# Patient Record
Sex: Female | Born: 1955 | Race: White | Hispanic: No | Marital: Married
Health system: Southern US, Community
[De-identification: ages and names within clinical notes are randomized; demographics above are authoritative.]

---

## 1999-06-22 ENCOUNTER — Other Ambulatory Visit: Admission: RE | Admit: 1999-06-22 | Discharge: 1999-06-22 | Payer: Self-pay | Admitting: *Deleted

## 2000-08-13 ENCOUNTER — Other Ambulatory Visit: Admission: RE | Admit: 2000-08-13 | Discharge: 2000-08-13 | Payer: Self-pay | Admitting: *Deleted

## 2000-12-27 ENCOUNTER — Other Ambulatory Visit: Admission: RE | Admit: 2000-12-27 | Discharge: 2000-12-27 | Payer: Self-pay | Admitting: *Deleted

## 2001-03-10 ENCOUNTER — Encounter (INDEPENDENT_AMBULATORY_CARE_PROVIDER_SITE_OTHER): Payer: Self-pay | Admitting: Specialist

## 2001-03-10 ENCOUNTER — Inpatient Hospital Stay (HOSPITAL_COMMUNITY): Admission: AD | Admit: 2001-03-10 | Discharge: 2001-03-10 | Payer: Self-pay | Admitting: Obstetrics and Gynecology

## 2001-05-29 ENCOUNTER — Other Ambulatory Visit: Admission: RE | Admit: 2001-05-29 | Discharge: 2001-05-29 | Payer: Self-pay | Admitting: Obstetrics and Gynecology

## 2002-09-03 ENCOUNTER — Other Ambulatory Visit: Admission: RE | Admit: 2002-09-03 | Discharge: 2002-09-03 | Payer: Self-pay | Admitting: Obstetrics and Gynecology

## 2003-09-09 ENCOUNTER — Other Ambulatory Visit: Admission: RE | Admit: 2003-09-09 | Discharge: 2003-09-09 | Payer: Self-pay | Admitting: Internal Medicine

## 2004-11-21 ENCOUNTER — Encounter (INDEPENDENT_AMBULATORY_CARE_PROVIDER_SITE_OTHER): Payer: Self-pay | Admitting: *Deleted

## 2004-11-21 ENCOUNTER — Ambulatory Visit (HOSPITAL_BASED_OUTPATIENT_CLINIC_OR_DEPARTMENT_OTHER): Admission: RE | Admit: 2004-11-21 | Discharge: 2004-11-21 | Payer: Self-pay | Admitting: Urology

## 2004-11-21 ENCOUNTER — Ambulatory Visit (HOSPITAL_COMMUNITY): Admission: RE | Admit: 2004-11-21 | Discharge: 2004-11-21 | Payer: Self-pay | Admitting: Urology

## 2005-02-14 ENCOUNTER — Encounter: Admission: RE | Admit: 2005-02-14 | Discharge: 2005-02-14 | Payer: Self-pay | Admitting: Obstetrics and Gynecology

## 2005-03-22 ENCOUNTER — Ambulatory Visit (HOSPITAL_COMMUNITY): Admission: RE | Admit: 2005-03-22 | Discharge: 2005-03-22 | Payer: Self-pay | Admitting: Obstetrics and Gynecology

## 2005-03-22 ENCOUNTER — Encounter (INDEPENDENT_AMBULATORY_CARE_PROVIDER_SITE_OTHER): Payer: Self-pay | Admitting: Specialist

## 2006-03-22 ENCOUNTER — Encounter: Admission: RE | Admit: 2006-03-22 | Discharge: 2006-03-22 | Payer: Self-pay | Admitting: Internal Medicine

## 2006-04-04 ENCOUNTER — Encounter: Admission: RE | Admit: 2006-04-04 | Discharge: 2006-04-04 | Payer: Self-pay | Admitting: Internal Medicine

## 2007-05-16 ENCOUNTER — Encounter: Admission: RE | Admit: 2007-05-16 | Discharge: 2007-05-16 | Payer: Self-pay | Admitting: Internal Medicine

## 2007-06-08 ENCOUNTER — Encounter: Admission: RE | Admit: 2007-06-08 | Discharge: 2007-06-08 | Payer: Self-pay | Admitting: Sports Medicine

## 2007-12-25 ENCOUNTER — Ambulatory Visit: Payer: Self-pay | Admitting: Vascular Surgery

## 2008-01-27 ENCOUNTER — Ambulatory Visit: Payer: Self-pay | Admitting: Vascular Surgery

## 2008-03-08 ENCOUNTER — Ambulatory Visit: Payer: Self-pay | Admitting: Vascular Surgery

## 2008-03-15 ENCOUNTER — Ambulatory Visit: Payer: Self-pay | Admitting: Vascular Surgery

## 2008-03-25 ENCOUNTER — Ambulatory Visit: Payer: Self-pay | Admitting: Vascular Surgery

## 2008-04-15 ENCOUNTER — Ambulatory Visit: Payer: Self-pay | Admitting: Vascular Surgery

## 2008-06-24 ENCOUNTER — Encounter: Admission: RE | Admit: 2008-06-24 | Discharge: 2008-06-24 | Payer: Self-pay | Admitting: Internal Medicine

## 2008-07-01 ENCOUNTER — Encounter: Admission: RE | Admit: 2008-07-01 | Discharge: 2008-07-01 | Payer: Self-pay | Admitting: Internal Medicine

## 2009-05-15 ENCOUNTER — Emergency Department (HOSPITAL_COMMUNITY): Admission: EM | Admit: 2009-05-15 | Discharge: 2009-05-15 | Payer: Self-pay | Admitting: Emergency Medicine

## 2009-09-13 ENCOUNTER — Encounter: Admission: RE | Admit: 2009-09-13 | Discharge: 2009-09-13 | Payer: Self-pay | Admitting: Internal Medicine

## 2010-01-18 ENCOUNTER — Encounter: Admission: RE | Admit: 2010-01-18 | Discharge: 2010-01-18 | Payer: Self-pay | Admitting: Obstetrics & Gynecology

## 2010-08-22 NOTE — Assessment & Plan Note (Signed)
OFFICE VISIT   Annette, Frey H  DOB:  26-Oct-1955                                       03/15/2008  [id number]#:[id number]   The patient underwent laser ablation of her left greater saphenous vein  with multiple stab phlebectomies 1 week ago for painful varicosities in  the thigh and the calf.  She has had minimal discomfort after this  procedure.  There has been some mild ecchymosis in the medial thigh  area, but very little tenderness she states.  She states that her leg  already feels better in the distal thigh and proximal calf area where  the varicosities were located laterally.  She has had no distal edema.   EXAMINATION:  The stab phlebectomy sites are healing nicely and there is  no distal edema with some mild ecchymosis medially and some mild  tenderness over the left greater saphenous vein.   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  She was reassured regarding this and will  return for 2 sessions of sclerotherapy to complete her treatment.  She  is very pleased with her early result.   Quita Skye Hart [place], M.D.  Electronically Signed   JDL/MEDQ  D:  03/15/2008  T:  03/16/2008  Job:  [id number]

## 2010-08-22 NOTE — Consult Note (Signed)
VASCULAR SURGERY CONSULTATION   Wire, Milina H  DOB:  01-25-1956                                       01/27/2008  [id number]#:[id number]   The patient is a 55 year old female who is for vascular surgery  evaluation regarding severe venous insufficiency with aching, throbbing,  and burning sensation in the left leg.  She describes an itching,  nocturnal cramping, tenderness, and heaviness in the left leg, both in  the thigh and the calf.  Over the last 3 years, she has noticed  increasing bulging varicosities in the left leg, particularly in the  distal thigh extending down around the knee.  She had some sclerotherapy  with isotonic saline in 1994 by Dr. Graylon Gunning with early result, which  was satisfactory.  These have now recurred.  She does not have  significant symptoms in the contralateral right leg.  She has no history  of deep vein thrombosis, thrombophlebitis, pulmonary emboli.  She does  have swelling in both feet and ankles toward the end of the day.  She  has been wearing graduated compression, elastic compression stockings  within the last year with no improvement in her symptoms, and has also  tried elevation of the legs, as well as pain medication.  She has been  thoroughly evaluated at [facility] in June of  this year and treated conservatively with no improvement in her  symptoms.  She had a venous duplex exam performed at [facility] and  repeated today for confirmation in our office.  This reveals some  refluxing in the lateral accessory great saphenous vein communicating at  the saphenofemoral junction where there is gross reflux at the junction.  This communicates with the varicosities along the anterolateral aspect  of the left thigh and down the left calf, where the symptomatology is  most severe.  She has no deep vein insufficiency or obstruction.   ALLERGIES:  Sulfa.   PAST MEDICAL HISTORY:  Includes Graves disease  status post radiation and  now hypothyroidism.   MEDICATIONS:  Include Synthroid and Indocin.   PREVIOUS SURGERY:  Includes arthroscopy of the left knee.   FAMILY HISTORY:  Positive for venous disease in her mother and  grandmother.   PHYSICAL EXAM:  Blood pressure 126/83, heart rate is 99, respirations  14.  Generally, she is a healthy-appearing female, in no apparent  distress.  Alert and oriented x3.  Neck is supple.  3+ carotid pulses  palpable.  No bruits are audible.  Neurologic exam is normal.  No  palpable adenopathy in the neck.  Chest is clear to auscultation.  Cardiovascular exam is regular rhythm with no murmurs.  Abdomen is soft  and nontender with no masses.  She has 3+ femoral, popliteal, dorsalis  pedis, and posterior tibial pulses palpable bilaterally.  Left leg has  diffuse varicosities beginning in the anterolateral thigh extending down  anteriorly in the thigh and lateral to the knee, extending into the left  pretibial area.  These are larger in the thigh and become slightly  smaller in the calf area and the pretibial region.  She also has some  medial calf varicosities of the great saphenous system.  She has 1+  edema distally with no hyperpigmentation or ulceration.  Right leg has  smaller varicosities in a similar distribution, but no distal edema.  I do think that this patient has significant symptomatology of her  venous disease in her left leg, which is affecting her daily living, and  not controlled by conservative measures, such as elastic compression,  elevation, and analgesics.  I would recommend laser ablation of the  lateral accessory branch of her left greater saphenous vein where the  reflux is most prominent, up to the saphenofemoral junction, as well as  stab phlebectomies in the same setting in the thigh.  She will then  require at least 2 sessions of sclerotherapy for the other varicosities.  We will proceed with preauthorization for this.    Quita Skye Hart [place], M.D.  Electronically Signed  JDL/MEDQ  D:  01/27/2008  T:  01/28/2008  Job:  [id number]

## 2010-08-22 NOTE — Procedures (Signed)
DUPLEX DEEP VENOUS EXAM - LOWER EXTREMITY   INDICATION:  Follow-up evaluation of left leg laser ablation of greater  saphenous vein.   HISTORY:  Edema:  No.  Trauma/Surgery:  One week post left leg ablation of greater saphenous  vein.  Pain:  Residual left leg pain.  PE:  No.  Previous DVT:  No.  Anticoagulants:  No.  Other:  Thrombosis.   DUPLEX EXAM:                CFV   SFV   PopV  PTV    GSV                R  L  R  L  R  L  R   L  R  L  Thrombosis       o     o     o      o     +  Spontaneous      +     +     +      +     o  Phasic           +     +     +      +     o  Augmentation     +     +     +      +     o  Compressible     +     +     +      +     o  Competent        o     +     +      +     o   Legend:  + - yes  o - no  p - partial  D - decreased   IMPRESSION:  1. The lateral branch of the greater saphenous vein is thrombosed from      the saphenofemoral junction to the mid thigh.  The mid thigh      lateral branch is patent.  Varicose vein branches of the lateral      branch in the distal thigh are thrombosed.  2. The medial left greater saphenous vein branch is patent with      minimal reflux.  3. Left common femoral vein demonstrates reflux.        _____________________________  Quita Skye. Hart [place], M.D.   MC/MEDQ  D:  03/15/2008  T:  03/16/2008  Job:  [id number]

## 2010-08-22 NOTE — Procedures (Signed)
LOWER EXTREMITY VENOUS REFLUX EXAM   INDICATION:  Left leg varicose vein with pain.   EXAM:  Using color-flow imaging and pulse Doppler spectral analysis, the  left common femoral, superficial femoral, popliteal, posterior tibial,  greater and lesser saphenous veins are evaluated.  There is no evidence  suggesting deep venous insufficiency in the left lower extremity.   The left saphenofemoral junction is not competent.  The left GSV is  competent with the caliber as described below.   The left proximal short saphenous vein demonstrates competency.   GSV Diameter (used if found to be incompetent only)                                            Right    Left  Proximal Greater Saphenous Vein           cm       cm  Proximal-to-mid-thigh                     cm       cm  Mid thigh                                 cm       cm  Mid-distal thigh                          cm       cm  Distal thigh                              cm       cm  Knee                                      cm       cm   IMPRESSION:  1. Left greater saphenous vein, lateral branch, reflux is identified      with the caliber ranging from 0.44 cm to 0.80 cm, mid thigh to      groin.  2. The left greater saphenous vein is not aneurysmal.  3. The left greater saphenous vein is not tortuous.  4. The deep venous system is competent.  5. The left lesser saphenous vein is competent.  6. No evidence of deep venous thrombosis noted in the left leg.       ___________________________________________  Quita Skye. Hart [place], M.D.   MG/MEDQ  D:  01/27/2008  T:  01/27/2008  Job:  [id number]

## 2010-08-25 NOTE — Op Note (Signed)
Annette Frey, Annette Frey               ACCOUNT NO.:  [id number]   MEDICAL RECORD NO.:  [id number]          PATIENT TYPE:  AMB   LOCATION:  [facility]                           FACILITY:  [facility]   PHYSICIAN:  Maxie Better, M.D.DATE OF BIRTH:  05-Mar-1956   DATE OF PROCEDURE:  03/22/2005  DATE OF DISCHARGE:                                 OPERATIVE REPORT   PREOPERATIVE DIAGNOSES:  1.  Dysfunctional uterine bleeding.  2.  Endometrial mass.   OPERATION/PROCEDURE:  1.  Diagnostic hysteroscopy.  2.  Hysteroscopic resection of the endometrial polyp.  3.  Dilatation and curettage.   POSTOPERATIVE DIAGNOSES:  1.  Dysfunctional uterine bleeding.  2.  Endometrial mass.   ANESTHESIA:  General.   SURGEON:  Maxie Better, M.D.   DESCRIPTION OF PROCEDURE:  Under adequate general anesthesia, the patient  was placed in the dorsal lithotomy position.  She was sterilely prepped and  draped in the usual fashion.  The bladder was catheterized for a scant  amount of urine.  Examination under anesthesia revealed anteverted uterus.  No adnexal masses could be appreciated.  A bivalve speculum was placed in  the vagina.  A single-tooth tenaculum was placed on the anterior lip of the  cervix.  The cervix was then serially dilated up to #31 [facility] dilator.  A  resectoscope was then introduced into the uterine cavity without incident.  Thickened endometrium was noted.  In particularly, left posterior aspect of  the endometrial cavity had a raised broadly based lesion suggestive of  endometrial polyp.  The thickening and the polypoid lesion was resected.  The resectoscope was then removed.  The cavity was then curetted for a large  amount of tissue.  The resectoscope was then reinserted, cavity was  inspected.  Both tubal ostia could be seen well.  The endocervical canal was  inspected.  No lesions noted.  Given the absence of any further lesions, the  resectoscope was removed.  The cavity was curetted and  all instruments were  then removed from the vagina.  Specimens labeled initial curettings and  endometrial resection was sent to pathology.  Estimated blood loss was  minimal.  Fluid deficit was 50 mL.  There were no complications.  The  patient tolerated the procedure well and was transferred to the recovery  room in stable condition.      Maxie Better, M.D.  Electronically Signed    [place]/MEDQ  D:  03/22/2005  T:  03/23/2005  Job:  [id number]

## 2010-08-25 NOTE — Op Note (Signed)
Annette Frey, Annette Frey               ACCOUNT NO.:  [id number]   MEDICAL RECORD NO.:  [id number]          PATIENT TYPE:  AMB   LOCATION:  [facility]                         FACILITY:  [facility]   PHYSICIAN:  Jamison Neighbor, M.D.  DATE OF BIRTH:  Nov 25, 1955   DATE OF PROCEDURE:  11/21/2004  DATE OF DISCHARGE:                                 OPERATIVE REPORT   PREOPERATIVE DIAGNOSES:  1.  Hematuria.  2.  Chronic cystitis.  3.  Urgency incontinence.  4.  Rule out interstitial cystitis.   POSTOPERATIVE DIAGNOSES:  1.  Hematuria.  2.  Chronic cystitis.  3.  Urgency incontinence.  4.  Rule out interstitial cystitis.  5.  Squamous metaplasia.   PROCEDURE:  Cystoscopy, urethral calibration, hydrodistention of the  bladder, bilateral retrogrades with interpretation, bladder biopsy with  cauterization, cauterization of squamous metaplasia.   SURGEON:  Jamison Neighbor, M.D.   ANESTHESIA:  General.   COMPLICATIONS:  None.   DRAINS:  None.   HISTORY:  This 55 year old female presenting to the office with problems  with poorly controlled urgency and frequency and intermittent episodes of  chronic cystitis.  The patient has had chronic pain in the bladder and  urethra and it has been thought that she might have interstitial cystitis.  As part of her evaluation, we found that she did have abnormal urine.  The  patient was placed on antibiotic prophylaxis in order to eliminate the risks  for infection.  She was placed on Vesicare for her urgency and frequency.  She does feel somewhat better, indicating that she likely does not have IC,  but she does need cystoretrogrades, etc., because of the long smoking  history.  The patient understands that she will undergo retrograde studies  plus a probable biopsy to rule out carcinoma in situ.  She gave full  informed consent.   DESCRIPTION OF PROCEDURE:  After successful induction of general anesthesia,  the patient was placed in the dorsal lithotomy  position, prepped with  Betadine and draped in the usual sterile fashion.  Careful bimanual  examination revealed no evidence of a cystocele, rectocele, or enterocele.  There were no masses on bimanual exam.  Uterus was palpably normal with no  signs of abnormalities.  The cystourethra was dilated to 32-French with no  signs of stenosis or stricture indicating an unremarkable urethra.  The  cystoscope was inserted.  The bladder was carefully inspected.  It was free  of any tumor or stones.  There was no evidence of carcinoma in situ or other  irregularities caused by her smoking.  The patient did, however, have  significant squamous metaplasia to the base of the bladder.  This may be  what previous urologist had told her was bladder polyps.  The patient  underwent bilateral retrograde studies.  The retrograde ureteral pyelograms  were interpreted as normal.  Contrast was injected up a 5-French open-ended  catheter on each side.  The ureters were normal in their appearance.  No  filling defects could be seen.  The collecting system on both sides filled  normally without any  evidence of hydronephrosis or lesion.  The drain out  films were normal.  The patient then underwent hydrodistention.  The bladder  was filled at a pressure of 100 cmH2O for 5 minutes.  When the bladder was  drained, no glomerulations or ulcers could be seen.  The bladder capacity  was just under 1000 cc, which is quite close to normal bladder capacity  indicating that this patient's problem is much more of an issue with urgency  and chronic cystitis versus a true interstitial cystitis.  A bladder biopsy  was performed in the area of the trigone.  This will be sent for analysis to  determine if there is any evidence of carcinoma in situ.  The biopsy site  was cauterized, as was the area of squamous metaplasia, with the hope that  this would cut down on the patient's persistent hematuria.  The bladder was  drained.  A  mixture of Marcaine and Pyridium was left in the bladder.  Marcaine and Pyridium  were injected periurethrally.  The patient received  intraoperative Toradol and Zofran as well as a B&O suppository.  She will  continue on her current medications, most particularly the Vesicare which  she has found quite helpful, and the Macrodantin prophylaxis.  She will be  sent home with a urinary analgesic as well as pain medication.  She will  return to the office in followup.  Our plan long-term is to keep her on  anticholinergics plus antibiotic suppression and only consider additional  evaluation for enterocele cystitis if her symptoms should deteriorate on  that regimen.           ______________________________  Jamison Neighbor, M.D.  Electronically Signed     RJE/MEDQ  D:  11/21/2004  T:  11/21/2004  Job:  [id number]   cc:   Edwena Felty. Romine, M.D.  [address], [address]  [place]  [place] [address]  Fax: [phone]   Larina Earthly, M.D.  [address]  [place]  [place] [address]  Fax: [phone]

## 2013-03-11 ENCOUNTER — Other Ambulatory Visit: Payer: Self-pay

## 2013-03-11 DIAGNOSIS — Z1231 Encounter for screening mammogram for malignant neoplasm of breast: Secondary | ICD-10-CM

## 2013-04-14 ENCOUNTER — Other Ambulatory Visit: Payer: Self-pay

## 2013-04-14 ENCOUNTER — Ambulatory Visit
Admission: RE | Admit: 2013-04-14 | Discharge: 2013-04-14 | Disposition: A | Discharge status: Home / Self Care | Payer: BC Managed Care – PPO | Source: Ambulatory Visit

## 2013-04-14 DIAGNOSIS — Z1231 Encounter for screening mammogram for malignant neoplasm of breast: Secondary | ICD-10-CM

## 2013-04-17 ENCOUNTER — Other Ambulatory Visit: Payer: Self-pay | Admitting: Internal Medicine

## 2013-04-17 DIAGNOSIS — R928 Other abnormal and inconclusive findings on diagnostic imaging of breast: Secondary | ICD-10-CM

## 2013-05-05 ENCOUNTER — Ambulatory Visit
Admission: RE | Admit: 2013-05-05 | Discharge: 2013-05-05 | Disposition: A | Discharge status: Home / Self Care | Payer: BC Managed Care – PPO | Source: Ambulatory Visit | Attending: Internal Medicine | Admitting: Internal Medicine

## 2013-05-05 DIAGNOSIS — R928 Other abnormal and inconclusive findings on diagnostic imaging of breast: Secondary | ICD-10-CM

## 2014-07-06 ENCOUNTER — Other Ambulatory Visit: Payer: Self-pay

## 2014-07-06 DIAGNOSIS — Z1231 Encounter for screening mammogram for malignant neoplasm of breast: Secondary | ICD-10-CM

## 2014-07-14 ENCOUNTER — Ambulatory Visit
Admission: RE | Admit: 2014-07-14 | Discharge: 2014-07-14 | Disposition: A | Discharge status: Home / Self Care | Payer: BLUE CROSS/BLUE SHIELD | Source: Ambulatory Visit

## 2014-07-14 DIAGNOSIS — Z1231 Encounter for screening mammogram for malignant neoplasm of breast: Secondary | ICD-10-CM

## 2015-11-23 ENCOUNTER — Other Ambulatory Visit: Payer: Self-pay | Admitting: Internal Medicine

## 2015-11-23 DIAGNOSIS — Z1231 Encounter for screening mammogram for malignant neoplasm of breast: Secondary | ICD-10-CM

## 2015-12-05 ENCOUNTER — Ambulatory Visit
Admission: RE | Admit: 2015-12-05 | Discharge: 2015-12-05 | Disposition: A | Discharge status: Home / Self Care | Payer: BLUE CROSS/BLUE SHIELD | Source: Ambulatory Visit | Attending: Internal Medicine | Admitting: Internal Medicine

## 2015-12-05 DIAGNOSIS — Z1231 Encounter for screening mammogram for malignant neoplasm of breast: Secondary | ICD-10-CM

## 2015-12-13 ENCOUNTER — Other Ambulatory Visit: Payer: Self-pay | Admitting: Orthopedic Surgery

## 2015-12-13 DIAGNOSIS — M25551 Pain in right hip: Secondary | ICD-10-CM

## 2015-12-28 ENCOUNTER — Ambulatory Visit
Admission: RE | Admit: 2015-12-28 | Discharge: 2015-12-28 | Disposition: A | Discharge status: Home / Self Care | Payer: BLUE CROSS/BLUE SHIELD | Source: Ambulatory Visit | Attending: Orthopedic Surgery | Admitting: Orthopedic Surgery

## 2015-12-28 DIAGNOSIS — M25551 Pain in right hip: Secondary | ICD-10-CM

## 2015-12-28 MED ORDER — IOPAMIDOL (ISOVUE-M 200) INJECTION 41%
9.0000 mL | Freq: Once | INTRAMUSCULAR | Status: AC
  Administered 2015-12-28: 9 mL via INTRA_ARTICULAR

## 2017-04-15 IMAGING — MG 2D DIGITAL SCREENING BILATERAL MAMMOGRAM WITH CAD AND ADJUNCT TO
9 of 13 series · 9 of 29 positions shown · non-contrast
Comparison: Previous exam(s).

CLINICAL DATA: Screening.

EXAM:
2D DIGITAL SCREENING BILATERAL MAMMOGRAM WITH CAD AND ADJUNCT TOMO

[L CC (1 of 2)]
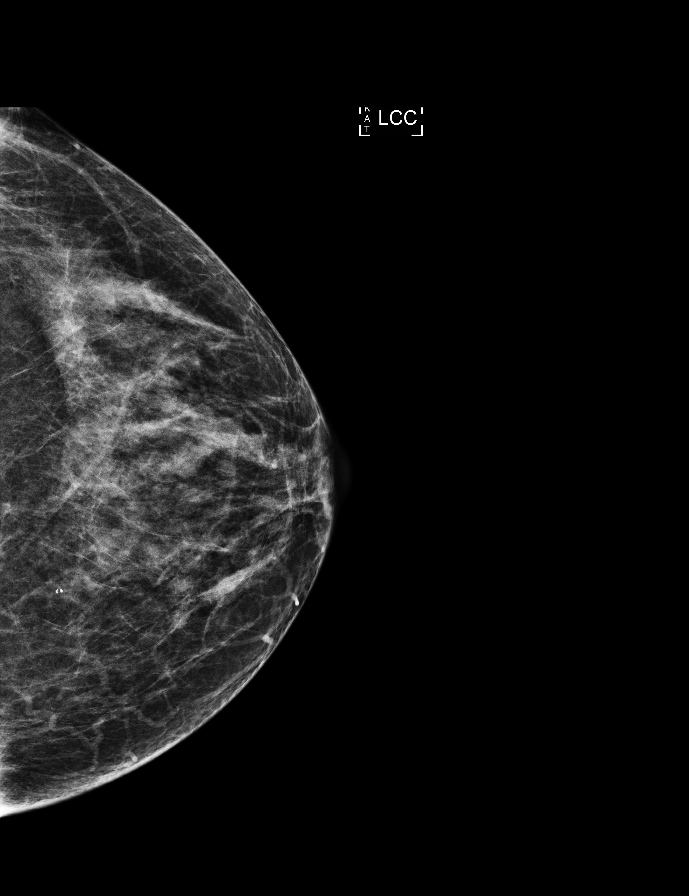

[L CC synth-2D]
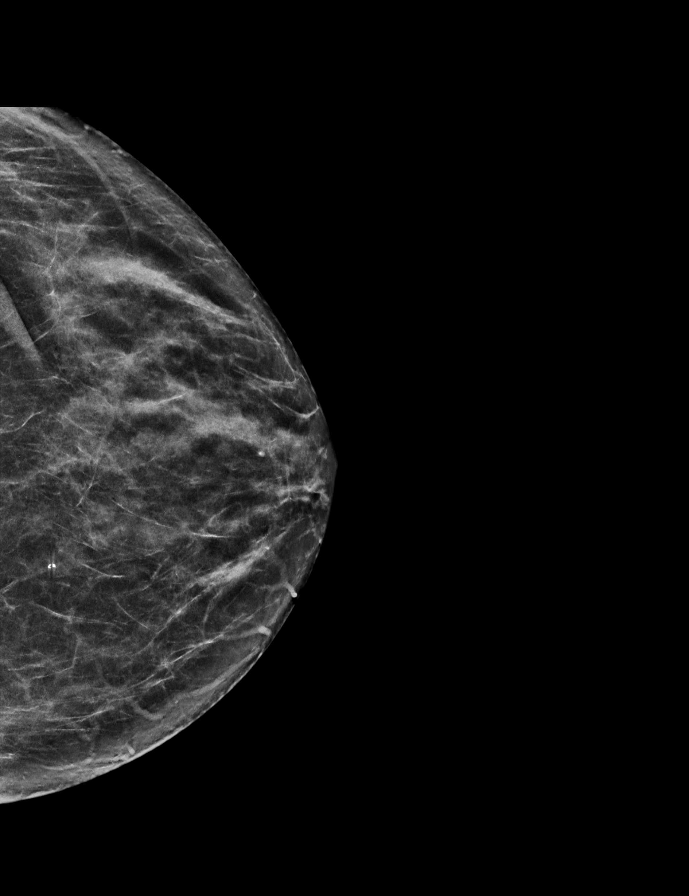

[R CC]
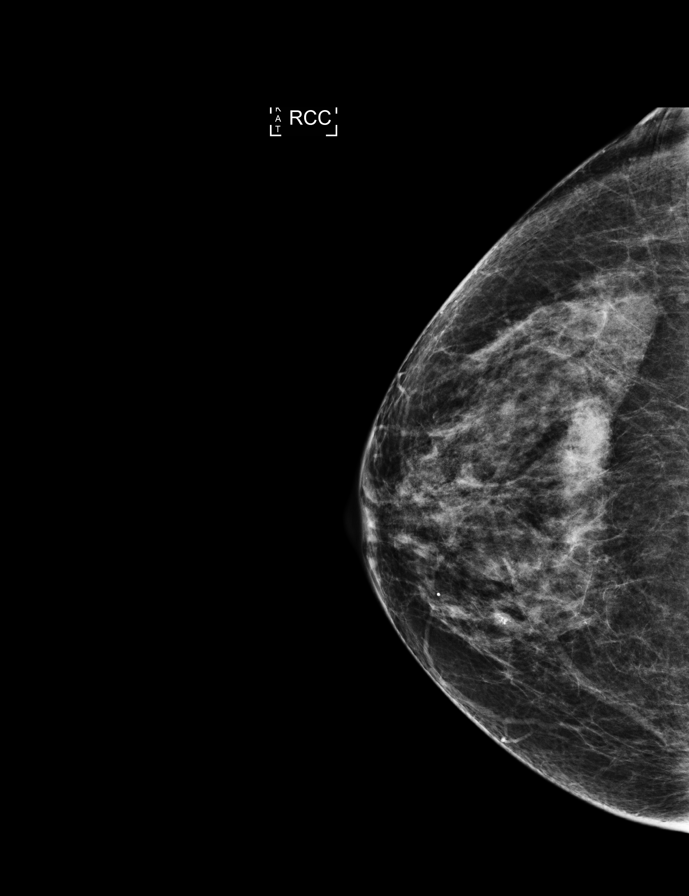

[R MLO]
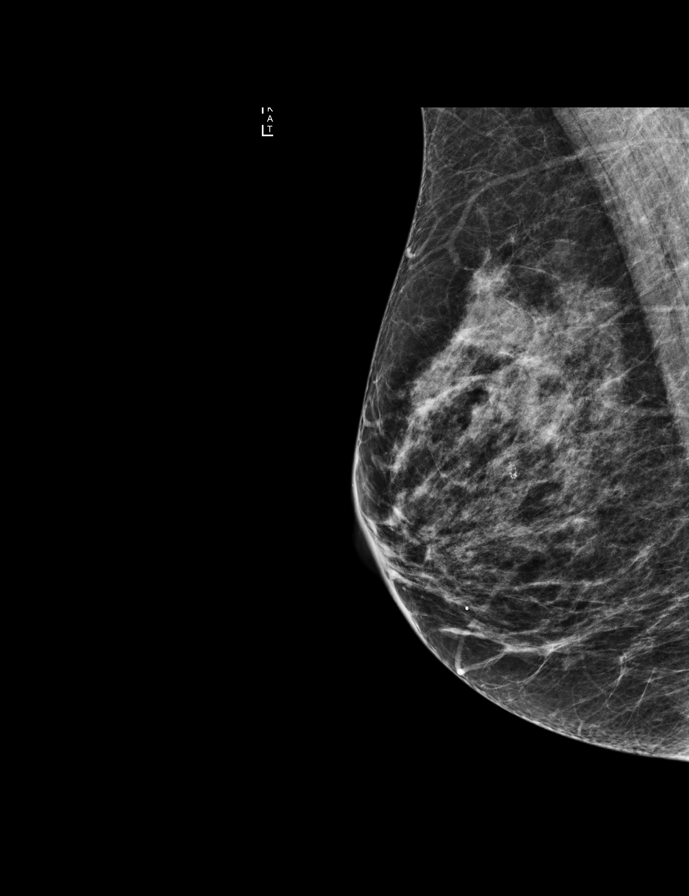

[R CC synth-2D]
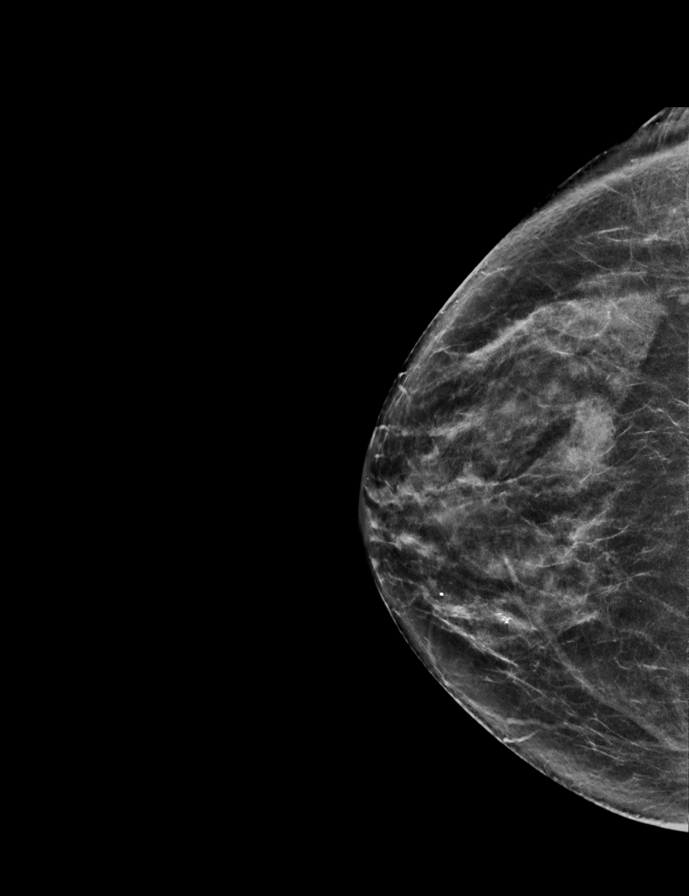

[L MLO]
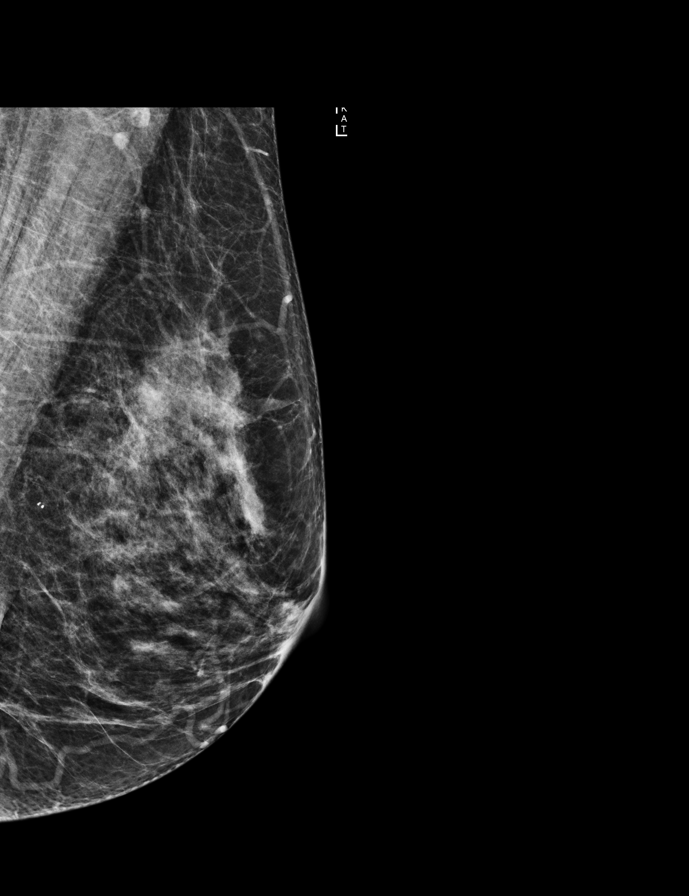

[R MLO synth-2D]
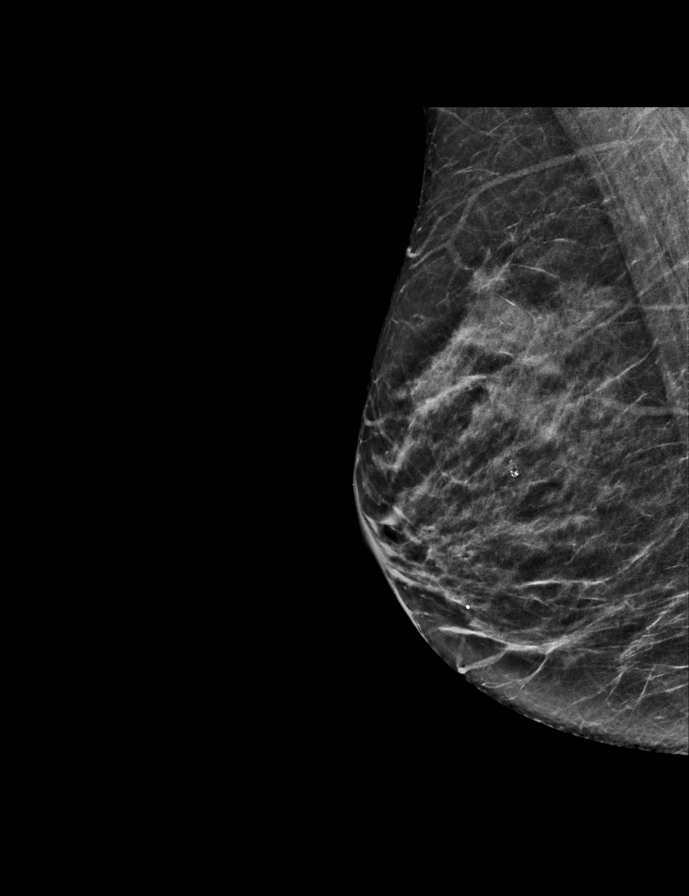

[L MLO synth-2D]
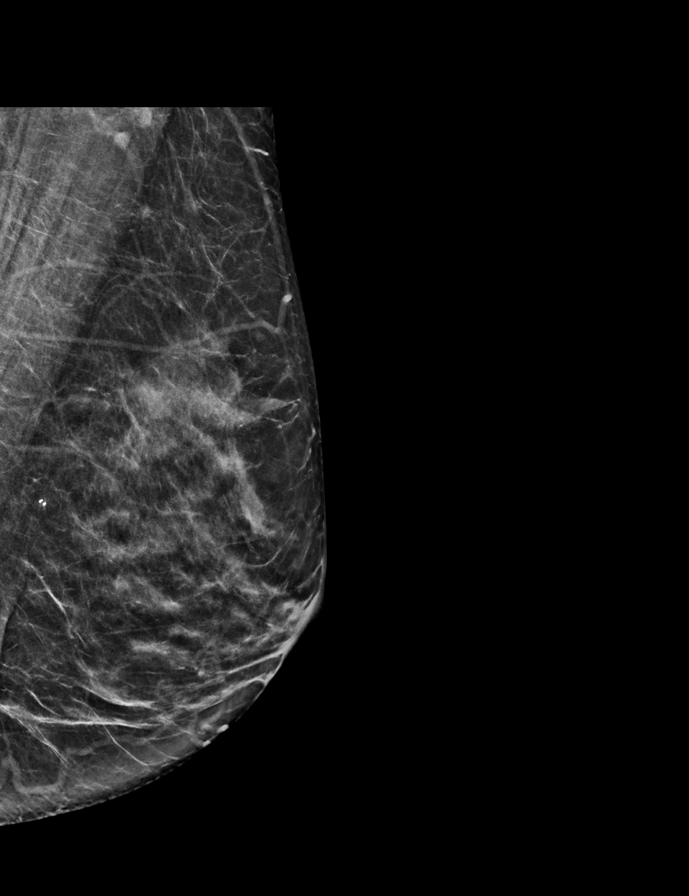

[L CC (2 of 2)]
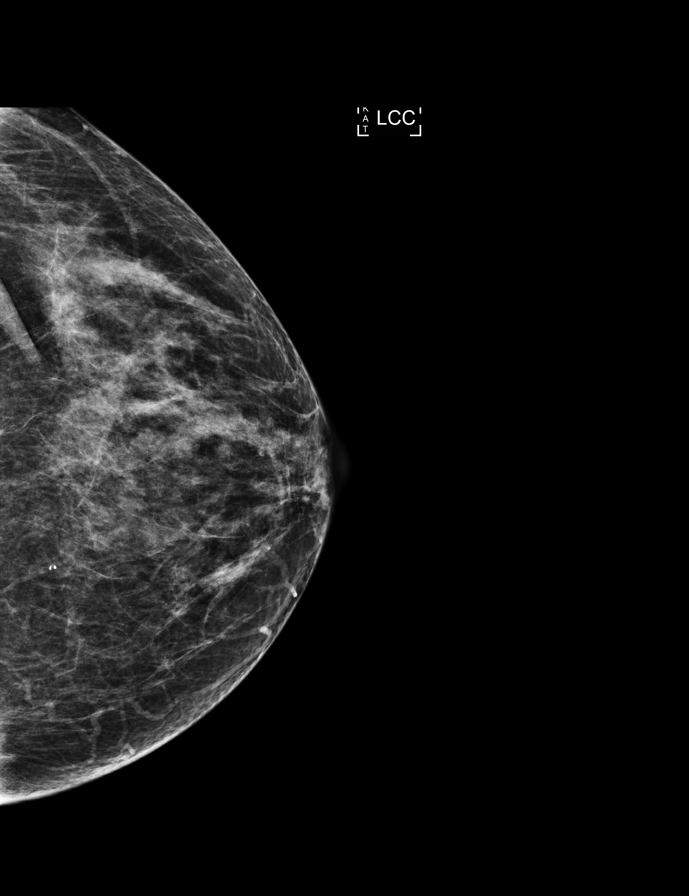

[9 of 29 positions shown; findings below may reference images not displayed]

ACR Breast Density Category c: The breast tissue is heterogeneously
dense, which may obscure small masses.
FINDINGS: There are no findings suspicious for malignancy. Images were
processed with CAD.
IMPRESSION: No mammographic evidence of malignancy. A result letter of this
screening mammogram will be mailed directly to the patient.

RECOMMENDATION:
Screening mammogram in one year. (Code:TN-0-K4T)

BI-RADS CATEGORY  1: Negative.

## 2017-11-11 ENCOUNTER — Other Ambulatory Visit: Payer: Self-pay | Admitting: Internal Medicine

## 2017-11-11 DIAGNOSIS — Z1231 Encounter for screening mammogram for malignant neoplasm of breast: Secondary | ICD-10-CM

## 2017-11-12 ENCOUNTER — Other Ambulatory Visit: Payer: Self-pay | Admitting: Adult Health

## 2017-11-12 ENCOUNTER — Other Ambulatory Visit: Payer: Self-pay | Admitting: Internal Medicine

## 2017-11-12 DIAGNOSIS — N644 Mastodynia: Secondary | ICD-10-CM

## 2019-06-19 ENCOUNTER — Encounter (HOSPITAL_COMMUNITY): Payer: No Typology Code available for payment source

## 2019-06-19 ENCOUNTER — Encounter (HOSPITAL_COMMUNITY): Admission: RE | Admit: 2019-06-19 | Payer: No Typology Code available for payment source | Source: Ambulatory Visit

## 2019-06-25 ENCOUNTER — Ambulatory Visit: Admit: 2019-06-25 | Payer: BLUE CROSS/BLUE SHIELD | Admitting: Orthopedic Surgery

## 2019-06-25 SURGERY — ARTHROPLASTY, HIP, TOTAL, ANTERIOR APPROACH
Anesthesia: Spinal | Site: Hip | Laterality: Left

## 2023-07-17 ENCOUNTER — Other Ambulatory Visit (HOSPITAL_COMMUNITY): Payer: Self-pay | Admitting: *Deleted

## 2023-07-18 ENCOUNTER — Ambulatory Visit (HOSPITAL_COMMUNITY)
Admission: RE | Admit: 2023-07-18 | Discharge: 2023-07-18 | Disposition: A | Discharge status: Home / Self Care | Source: Ambulatory Visit | Attending: Internal Medicine | Admitting: Internal Medicine

## 2023-07-18 DIAGNOSIS — E785 Hyperlipidemia, unspecified: Secondary | ICD-10-CM | POA: Diagnosis present

## 2023-07-18 MED ORDER — ROMOSOZUMAB-AQQG 105 MG/1.17ML ~~LOC~~ SOSY
210.0000 mg | PREFILLED_SYRINGE | Freq: Once | SUBCUTANEOUS | Status: DC
  Administered 2023-07-18: 210 mg via SUBCUTANEOUS
  Filled 2023-07-18: qty 2.4

## 2023-08-19 ENCOUNTER — Ambulatory Visit (HOSPITAL_COMMUNITY)
Admission: RE | Admit: 2023-08-19 | Discharge: 2023-08-19 | Disposition: A | Discharge status: Home / Self Care | Payer: Self-pay | Source: Ambulatory Visit | Attending: Internal Medicine | Admitting: Internal Medicine

## 2023-08-19 DIAGNOSIS — M810 Age-related osteoporosis without current pathological fracture: Secondary | ICD-10-CM | POA: Insufficient documentation

## 2023-08-19 MED ORDER — ROMOSOZUMAB-AQQG 105 MG/1.17ML ~~LOC~~ SOSY
210.0000 mg | PREFILLED_SYRINGE | Freq: Once | SUBCUTANEOUS | Status: DC
  Administered 2023-08-19: 210 mg via SUBCUTANEOUS
  Filled 2023-08-19: qty 2.4

## 2023-09-13 ENCOUNTER — Telehealth: Payer: Self-pay

## 2023-09-13 NOTE — Telephone Encounter (Signed)
 Auth Submission: NO AUTH NEEDED Site of care: Site of care: [facility] Payer: Medicare A/B with BCBS supplement Medication & CPT/J Code(s) submitted: Evenity  (Romosozumab ) J3111 Route of submission (phone, fax, portal):  Phone # Fax # Auth type: Buy/Bill PB Units/visits requested: 210mg  x 10 doses Reference number:  Approval from: 07/09/23 to 05/09/24

## 2023-09-16 ENCOUNTER — Ambulatory Visit (HOSPITAL_COMMUNITY)
Admission: RE | Admit: 2023-09-16 | Discharge: 2023-09-16 | Disposition: A | Discharge status: Home / Self Care | Source: Ambulatory Visit | Attending: Internal Medicine | Admitting: Internal Medicine

## 2023-09-16 DIAGNOSIS — M810 Age-related osteoporosis without current pathological fracture: Secondary | ICD-10-CM | POA: Insufficient documentation

## 2023-09-16 MED ORDER — ROMOSOZUMAB-AQQG 105 MG/1.17ML ~~LOC~~ SOSY
210.0000 mg | PREFILLED_SYRINGE | Freq: Once | SUBCUTANEOUS | Status: AC
  Administered 2023-09-16: 210 mg via SUBCUTANEOUS
  Filled 2023-09-16: qty 2.4

## 2023-10-16 ENCOUNTER — Ambulatory Visit (HOSPITAL_COMMUNITY)
Admission: RE | Admit: 2023-10-16 | Discharge: 2023-10-16 | Disposition: A | Discharge status: Home / Self Care | Source: Ambulatory Visit | Attending: Internal Medicine | Admitting: Internal Medicine

## 2023-10-16 DIAGNOSIS — M810 Age-related osteoporosis without current pathological fracture: Secondary | ICD-10-CM | POA: Diagnosis present

## 2023-10-16 MED ORDER — ROMOSOZUMAB-AQQG 105 MG/1.17ML ~~LOC~~ SOSY
210.0000 mg | PREFILLED_SYRINGE | Freq: Once | SUBCUTANEOUS | Status: DC
  Administered 2023-10-16: 210 mg via SUBCUTANEOUS
  Filled 2023-10-16 (×2): qty 2.4

## 2023-10-30 ENCOUNTER — Other Ambulatory Visit (HOSPITAL_COMMUNITY): Payer: Self-pay | Admitting: Internal Medicine

## 2023-10-30 DIAGNOSIS — R600 Localized edema: Secondary | ICD-10-CM

## 2023-11-12 ENCOUNTER — Ambulatory Visit (HOSPITAL_COMMUNITY)
Admission: RE | Admit: 2023-11-12 | Discharge: 2023-11-12 | Disposition: A | Discharge status: Home / Self Care | Source: Ambulatory Visit | Attending: Internal Medicine | Admitting: Internal Medicine

## 2023-11-12 DIAGNOSIS — I082 Rheumatic disorders of both aortic and tricuspid valves: Secondary | ICD-10-CM | POA: Insufficient documentation

## 2023-11-12 DIAGNOSIS — R600 Localized edema: Secondary | ICD-10-CM | POA: Insufficient documentation

## 2023-11-12 LAB — ECHOCARDIOGRAM COMPLETE
Area-P 1/2: 8.8 cm2
Calc EF: 62 %
S' Lateral: 2.1 cm
Single Plane A2C EF: 56.8 %
Single Plane A4C EF: 67.4 %

## 2023-11-18 ENCOUNTER — Ambulatory Visit (HOSPITAL_COMMUNITY)
Admission: RE | Admit: 2023-11-18 | Discharge: 2023-11-18 | Disposition: A | Discharge status: Home / Self Care | Source: Ambulatory Visit | Attending: Internal Medicine | Admitting: Internal Medicine

## 2023-11-18 DIAGNOSIS — M810 Age-related osteoporosis without current pathological fracture: Secondary | ICD-10-CM | POA: Diagnosis present

## 2023-11-18 MED ORDER — ROMOSOZUMAB-AQQG 105 MG/1.17ML ~~LOC~~ SOSY
210.0000 mg | PREFILLED_SYRINGE | SUBCUTANEOUS | Status: DC
  Administered 2023-11-18 (×2): 210 mg via SUBCUTANEOUS
  Filled 2023-11-18: qty 2.4

## 2023-12-13 DIAGNOSIS — M810 Age-related osteoporosis without current pathological fracture: Secondary | ICD-10-CM | POA: Insufficient documentation

## 2023-12-18 ENCOUNTER — Inpatient Hospital Stay (HOSPITAL_COMMUNITY): Admission: RE | Admit: 2023-12-18 | Source: Ambulatory Visit

## 2023-12-18 ENCOUNTER — Ambulatory Visit (HOSPITAL_COMMUNITY)
Admission: RE | Admit: 2023-12-18 | Discharge: 2023-12-18 | Disposition: A | Discharge status: Home / Self Care | Source: Ambulatory Visit | Attending: Internal Medicine | Admitting: Internal Medicine

## 2023-12-18 VITALS — BP 120/79 | HR 87 | Temp 97.4°F | Resp 16

## 2023-12-18 DIAGNOSIS — M810 Age-related osteoporosis without current pathological fracture: Secondary | ICD-10-CM | POA: Diagnosis present

## 2023-12-18 MED ORDER — ROMOSOZUMAB-AQQG 105 MG/1.17ML ~~LOC~~ SOSY
PREFILLED_SYRINGE | SUBCUTANEOUS | Status: AC
  Filled 2023-12-18: qty 2.34

## 2023-12-18 MED ORDER — ROMOSOZUMAB-AQQG 105 MG/1.17ML ~~LOC~~ SOSY
210.0000 mg | PREFILLED_SYRINGE | Freq: Once | SUBCUTANEOUS | Status: AC
  Administered 2023-12-18: 210 mg via SUBCUTANEOUS

## 2024-01-17 ENCOUNTER — Encounter (HOSPITAL_COMMUNITY)

## 2024-01-17 ENCOUNTER — Encounter (HOSPITAL_COMMUNITY)
Admission: RE | Admit: 2024-01-17 | Discharge: 2024-01-17 | Disposition: A | Discharge status: Home / Self Care | Source: Ambulatory Visit | Attending: Internal Medicine | Admitting: Internal Medicine

## 2024-01-17 VITALS — BP 133/89 | HR 103 | Temp 97.0°F | Resp 16

## 2024-01-17 DIAGNOSIS — M810 Age-related osteoporosis without current pathological fracture: Secondary | ICD-10-CM | POA: Insufficient documentation

## 2024-01-17 MED ORDER — ROMOSOZUMAB-AQQG 105 MG/1.17ML ~~LOC~~ SOSY
PREFILLED_SYRINGE | SUBCUTANEOUS | Status: AC
  Filled 2024-01-17: qty 1.17

## 2024-01-17 MED ORDER — ROMOSOZUMAB-AQQG 105 MG/1.17ML ~~LOC~~ SOSY
210.0000 mg | PREFILLED_SYRINGE | Freq: Once | SUBCUTANEOUS | Status: AC
  Administered 2024-01-17: 210 mg via SUBCUTANEOUS

## 2024-02-17 ENCOUNTER — Ambulatory Visit (HOSPITAL_COMMUNITY)
Admission: RE | Admit: 2024-02-17 | Discharge: 2024-02-17 | Disposition: A | Discharge status: Home / Self Care | Source: Ambulatory Visit | Attending: Internal Medicine | Admitting: Internal Medicine

## 2024-02-17 VITALS — BP 132/81 | HR 120 | Temp 97.1°F | Resp 16

## 2024-02-17 DIAGNOSIS — M810 Age-related osteoporosis without current pathological fracture: Secondary | ICD-10-CM | POA: Diagnosis present

## 2024-02-17 MED ORDER — ROMOSOZUMAB-AQQG 105 MG/1.17ML ~~LOC~~ SOSY
PREFILLED_SYRINGE | SUBCUTANEOUS | Status: AC
  Filled 2024-02-17: qty 1.17

## 2024-02-17 MED ORDER — ROMOSOZUMAB-AQQG 105 MG/1.17ML ~~LOC~~ SOSY
210.0000 mg | PREFILLED_SYRINGE | Freq: Once | SUBCUTANEOUS | Status: AC
  Administered 2024-02-17: 210 mg via SUBCUTANEOUS

## 2024-03-18 ENCOUNTER — Ambulatory Visit (HOSPITAL_COMMUNITY)
Admission: RE | Admit: 2024-03-18 | Discharge: 2024-03-18 | Disposition: A | Discharge status: Home / Self Care | Source: Ambulatory Visit | Attending: Internal Medicine | Admitting: Internal Medicine

## 2024-03-18 VITALS — BP 121/70 | HR 103 | Temp 97.9°F | Resp 16

## 2024-03-18 DIAGNOSIS — M810 Age-related osteoporosis without current pathological fracture: Secondary | ICD-10-CM | POA: Diagnosis present

## 2024-03-18 MED ORDER — ROMOSOZUMAB-AQQG 105 MG/1.17ML ~~LOC~~ SOSY
210.0000 mg | PREFILLED_SYRINGE | Freq: Once | SUBCUTANEOUS | Status: AC
  Administered 2024-03-18: 210 mg via SUBCUTANEOUS

## 2024-03-18 MED ORDER — ROMOSOZUMAB-AQQG 105 MG/1.17ML ~~LOC~~ SOSY
PREFILLED_SYRINGE | SUBCUTANEOUS | Status: AC
  Filled 2024-03-18: qty 1.17

## 2024-03-31 ENCOUNTER — Encounter (HOSPITAL_COMMUNITY): Payer: Self-pay | Admitting: Internal Medicine

## 2024-03-31 NOTE — Addendum Note (Signed)
 Addended by: DAYNE SHERRY RAMAN on: 03/31/2024 11:08 AM   Modules accepted: Orders

## 2024-04-15 ENCOUNTER — Encounter (HOSPITAL_COMMUNITY): Payer: Self-pay | Admitting: Internal Medicine

## 2024-04-15 ENCOUNTER — Ambulatory Visit (HOSPITAL_COMMUNITY)
Admission: RE | Admit: 2024-04-15 | Discharge: 2024-04-15 | Disposition: A | Discharge status: Home / Self Care | Source: Ambulatory Visit | Attending: Internal Medicine | Admitting: Internal Medicine

## 2024-04-15 VITALS — BP 123/82 | HR 114 | Temp 97.1°F | Resp 17

## 2024-04-15 DIAGNOSIS — M810 Age-related osteoporosis without current pathological fracture: Secondary | ICD-10-CM | POA: Diagnosis present

## 2024-04-15 MED ORDER — ROMOSOZUMAB-AQQG 105 MG/1.17ML ~~LOC~~ SOSY
PREFILLED_SYRINGE | SUBCUTANEOUS | Status: AC
  Filled 2024-04-15: qty 1.17

## 2024-04-15 MED ORDER — ROMOSOZUMAB-AQQG 105 MG/1.17ML ~~LOC~~ SOSY
210.0000 mg | PREFILLED_SYRINGE | Freq: Once | SUBCUTANEOUS | Status: AC
  Administered 2024-04-15: 210 mg via SUBCUTANEOUS

## 2024-04-16 ENCOUNTER — Telehealth (HOSPITAL_COMMUNITY): Payer: Self-pay | Admitting: Pharmacy Technician

## 2024-04-16 NOTE — Telephone Encounter (Signed)
 Auth Submission: NO AUTH NEEDED Site of care: [facility] Payer: Medicare A/B, BCBS Supp   Medication & CPT/J Code(s) submitted: Evenity  (Romosozumab ) J3111 Diagnosis Code: M81.0 Route of submission (phone, fax, portal):  Phone # Fax # Auth type: Buy/Bill HB Units/visits requested: 210MG  EVERY 4 WEEKS (12 DOSES TOTAL) Reference number:  Approval from: 04/09/24 to 08/07/24     Dagoberto Armour, CPhT [facility] Phone: [phone] 04/16/2024

## 2024-05-15 ENCOUNTER — Inpatient Hospital Stay (HOSPITAL_COMMUNITY): Admission: RE | Admit: 2024-05-15 | Source: Ambulatory Visit

## 2024-05-15 VITALS — BP 127/85 | HR 117 | Temp 97.5°F | Resp 17

## 2024-05-15 DIAGNOSIS — M810 Age-related osteoporosis without current pathological fracture: Secondary | ICD-10-CM

## 2024-05-15 MED ORDER — ROMOSOZUMAB-AQQG 105 MG/1.17ML ~~LOC~~ SOSY
210.0000 mg | PREFILLED_SYRINGE | Freq: Once | SUBCUTANEOUS | Status: AC
  Administered 2024-05-15: 210 mg via SUBCUTANEOUS

## 2024-05-15 MED ORDER — ROMOSOZUMAB-AQQG 105 MG/1.17ML ~~LOC~~ SOSY
PREFILLED_SYRINGE | SUBCUTANEOUS | Status: AC
  Filled 2024-05-15: qty 1.17

## 2024-06-15 ENCOUNTER — Encounter (HOSPITAL_COMMUNITY)
